# Patient Record
Sex: Male | Born: 1975 | Hispanic: No | Marital: Married | Smoking: Current every day smoker
Health system: Southern US, Community
[De-identification: ages and names within clinical notes are randomized; demographics above are authoritative.]

---

## 2015-09-11 ENCOUNTER — Emergency Department (HOSPITAL_COMMUNITY)
Admission: EM | Admit: 2015-09-11 | Discharge: 2015-09-11 | Disposition: A | Discharge status: Home / Self Care | Payer: No Typology Code available for payment source | Attending: Emergency Medicine | Admitting: Emergency Medicine

## 2015-09-11 ENCOUNTER — Emergency Department (HOSPITAL_COMMUNITY): Payer: No Typology Code available for payment source

## 2015-09-11 ENCOUNTER — Encounter (HOSPITAL_COMMUNITY): Payer: Self-pay | Admitting: *Deleted

## 2015-09-11 DIAGNOSIS — Y998 Other external cause status: Secondary | ICD-10-CM | POA: Diagnosis not present

## 2015-09-11 DIAGNOSIS — S134XXA Sprain of ligaments of cervical spine, initial encounter: Secondary | ICD-10-CM | POA: Insufficient documentation

## 2015-09-11 DIAGNOSIS — Y92410 Unspecified street and highway as the place of occurrence of the external cause: Secondary | ICD-10-CM | POA: Insufficient documentation

## 2015-09-11 DIAGNOSIS — S8391XA Sprain of unspecified site of right knee, initial encounter: Secondary | ICD-10-CM | POA: Diagnosis not present

## 2015-09-11 DIAGNOSIS — S86911A Strain of unspecified muscle(s) and tendon(s) at lower leg level, right leg, initial encounter: Secondary | ICD-10-CM | POA: Diagnosis not present

## 2015-09-11 DIAGNOSIS — S139XXA Sprain of joints and ligaments of unspecified parts of neck, initial encounter: Secondary | ICD-10-CM

## 2015-09-11 DIAGNOSIS — Y9389 Activity, other specified: Secondary | ICD-10-CM | POA: Insufficient documentation

## 2015-09-11 DIAGNOSIS — S199XXA Unspecified injury of neck, initial encounter: Secondary | ICD-10-CM | POA: Diagnosis present

## 2015-09-11 MED ORDER — NAPROXEN 500 MG PO TABS: 500.0000 mg | ORAL_TABLET | Freq: Two times a day (BID) | ORAL | Status: DC

## 2015-09-11 MED ORDER — METHOCARBAMOL 500 MG PO TABS: 500.0000 mg | ORAL_TABLET | Freq: Three times a day (TID) | ORAL | Status: DC | PRN

## 2015-09-11 NOTE — ED Notes (Signed)
Pt is in stable condition upon d/c and ambulates from ED. 
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
## 2015-09-11 NOTE — ED Notes (Signed)
Patient transported to CT 
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
## 2015-09-18 ENCOUNTER — Emergency Department (HOSPITAL_COMMUNITY)
Admission: EM | Admit: 2015-09-18 | Discharge: 2015-09-18 | Disposition: A | Discharge status: Home / Self Care | Payer: No Typology Code available for payment source | Attending: Emergency Medicine | Admitting: Emergency Medicine

## 2015-09-18 ENCOUNTER — Emergency Department (HOSPITAL_COMMUNITY): Payer: No Typology Code available for payment source

## 2015-09-18 ENCOUNTER — Encounter (HOSPITAL_COMMUNITY): Payer: Self-pay | Admitting: *Deleted

## 2015-09-18 DIAGNOSIS — Z791 Long term (current) use of non-steroidal anti-inflammatories (NSAID): Secondary | ICD-10-CM | POA: Insufficient documentation

## 2015-09-18 DIAGNOSIS — Y998 Other external cause status: Secondary | ICD-10-CM | POA: Insufficient documentation

## 2015-09-18 DIAGNOSIS — Y92410 Unspecified street and highway as the place of occurrence of the external cause: Secondary | ICD-10-CM | POA: Insufficient documentation

## 2015-09-18 DIAGNOSIS — R51 Headache: Secondary | ICD-10-CM

## 2015-09-18 DIAGNOSIS — S060X0A Concussion without loss of consciousness, initial encounter: Secondary | ICD-10-CM

## 2015-09-18 DIAGNOSIS — Y9389 Activity, other specified: Secondary | ICD-10-CM | POA: Insufficient documentation

## 2015-09-18 DIAGNOSIS — R519 Headache, unspecified: Secondary | ICD-10-CM

## 2015-09-18 MED ORDER — HYDROCODONE-ACETAMINOPHEN 5-325 MG PO TABS: 1.0000 | ORAL_TABLET | Freq: Four times a day (QID) | ORAL | Status: DC | PRN
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
## 2015-09-18 NOTE — Discharge Instructions (Signed)
Concussion, Adult A concussion, or closed-head injury, is a brain injury caused by a direct blow to the head or by a quick and sudden movement (jolt) of the head or neck. Concussions are usually not life-threatening. Even so, the effects of a concussion can be serious. If you have had a concussion before, you are more likely to experience concussion-like symptoms after a direct blow to the head.  CAUSES  Direct blow to the head, such as from running into another player during a soccer game, being hit in a fight, or hitting your head on a hard surface.  A jolt of the head or neck that causes the brain to move back and forth inside the skull, such as in a car crash. SIGNS AND SYMPTOMS The signs of a concussion can be hard to notice. Early on, they may be missed by you, family members, and health care providers. You may look fine but act or feel differently. Symptoms are usually temporary, but they may last for days, weeks, or even longer. Some symptoms may appear right away while others may not show up for hours or days. Every head injury is different. Symptoms include:  Mild to moderate headaches that will not go away.  A feeling of pressure inside your head.  Having more trouble than usual:  Learning or remembering things you have heard.  Answering questions.  Paying attention or concentrating.  Organizing daily tasks.  Making decisions and solving problems.  Slowness in thinking, acting or reacting, speaking, or reading.  Getting lost or being easily confused.  Feeling tired all the time or lacking energy (fatigued).  Feeling drowsy.  Sleep disturbances.  Sleeping more than usual.  Sleeping less than usual.  Trouble falling asleep.  Trouble sleeping (insomnia).  Loss of balance or feeling lightheaded or dizzy.  Nausea or vomiting.  Numbness or tingling.  Increased sensitivity to:  Sounds.  Lights.  Distractions.  Vision problems or eyes that tire  easily.  Diminished sense of taste or smell.  Ringing in the ears.  Mood changes such as feeling sad or anxious.  Becoming easily irritated or angry for little or no reason.  Lack of motivation.  Seeing or hearing things other people do not see or hear (hallucinations). DIAGNOSIS Your health care provider can usually diagnose a concussion based on a description of your injury and symptoms. He or she will ask whether you passed out (lost consciousness) and whether you are having trouble remembering events that happened right before and during your injury. Your evaluation might include:  A brain scan to look for signs of injury to the brain. Even if the test shows no injury, you may still have a concussion.  Blood tests to be sure other problems are not present. TREATMENT  Concussions are usually treated in an emergency department, in urgent care, or at a clinic. You may need to stay in the hospital overnight for further treatment.  Tell your health care provider if you are taking any medicines, including prescription medicines, over-the-counter medicines, and natural remedies. Some medicines, such as blood thinners (anticoagulants) and aspirin, may increase the chance of complications. Also tell your health care provider whether you have had alcohol or are taking illegal drugs. This information may affect treatment.  Your health care provider will send you home with important instructions to follow.  How fast you will recover from a concussion depends on many factors. These factors include how severe your concussion is, what part of your brain was injured,   your age, and how healthy you were before the concussion.  Most people with mild injuries recover fully. Recovery can take time. In general, recovery is slower in older persons. Also, persons who have had a concussion in the past or have other medical problems may find that it takes longer to recover from their current injury. HOME  CARE INSTRUCTIONS General Instructions  Carefully follow the directions your health care provider gave you.  Only take over-the-counter or prescription medicines for pain, discomfort, or fever as directed by your health care provider.  Take only those medicines that your health care provider has approved.  Do not drink alcohol until your health care provider says you are well enough to do so. Alcohol and certain other drugs may slow your recovery and can put you at risk of further injury.  If it is harder than usual to remember things, write them down.  If you are easily distracted, try to do one thing at a time. For example, do not try to watch TV while fixing dinner.  Talk with family members or close friends when making important decisions.  Keep all follow-up appointments. Repeated evaluation of your symptoms is recommended for your recovery.  Watch your symptoms and tell others to do the same. Complications sometimes occur after a concussion. Older adults with a brain injury may have a higher risk of serious complications, such as a blood clot on the brain.  Tell your teachers, school nurse, school counselor, coach, athletic trainer, or work manager about your injury, symptoms, and restrictions. Tell them about what you can or cannot do. They should watch for:  Increased problems with attention or concentration.  Increased difficulty remembering or learning new information.  Increased time needed to complete tasks or assignments.  Increased irritability or decreased ability to cope with stress.  Increased symptoms.  Rest. Rest helps the brain to heal. Make sure you:  Get plenty of sleep at night. Avoid staying up late at night.  Keep the same bedtime hours on weekends and weekdays.  Rest during the day. Take daytime naps or rest breaks when you feel tired.  Limit activities that require a lot of thought or concentration. These include:  Doing homework or job-related  work.  Watching TV.  Working on the computer.  Avoid any situation where there is potential for another head injury (football, hockey, soccer, basketball, martial arts, downhill snow sports and horseback riding). Your condition will get worse every time you experience a concussion. You should avoid these activities until you are evaluated by the appropriate follow-up health care providers. Returning To Your Regular Activities You will need to return to your normal activities slowly, not all at once. You must give your body and brain enough time for recovery.  Do not return to sports or other athletic activities until your health care provider tells you it is safe to do so.  Ask your health care provider when you can drive, ride a bicycle, or operate heavy machinery. Your ability to react may be slower after a brain injury. Never do these activities if you are dizzy.  Ask your health care provider about when you can return to work or school. Preventing Another Concussion It is very important to avoid another brain injury, especially before you have recovered. In rare cases, another injury can lead to permanent brain damage, brain swelling, or death. The risk of this is greatest during the first 7-10 days after a head injury. Avoid injuries by:  Wearing a   seat belt when riding in a car.  Drinking alcohol only in moderation.  Wearing a helmet when biking, skiing, skateboarding, skating, or doing similar activities.  Avoiding activities that could lead to a second concussion, such as contact or recreational sports, until your health care provider says it is okay.  Taking safety measures in your home.  Remove clutter and tripping hazards from floors and stairways.  Use grab bars in bathrooms and handrails by stairs.  Place non-slip mats on floors and in bathtubs.  Improve lighting in dim areas. SEEK MEDICAL CARE IF:  You have increased problems paying attention or  concentrating.  You have increased difficulty remembering or learning new information.  You need more time to complete tasks or assignments than before.  You have increased irritability or decreased ability to cope with stress.  You have more symptoms than before. Seek medical care if you have any of the following symptoms for more than 2 weeks after your injury:  Lasting (chronic) headaches.  Dizziness or balance problems.  Nausea.  Vision problems.  Increased sensitivity to noise or light.  Depression or mood swings.  Anxiety or irritability.  Memory problems.  Difficulty concentrating or paying attention.  Sleep problems.  Feeling tired all the time. SEEK IMMEDIATE MEDICAL CARE IF:  You have severe or worsening headaches. These may be a sign of a blood clot in the brain.  You have weakness (even if only in one hand, leg, or part of the face).  You have numbness.  You have decreased coordination.  You vomit repeatedly.  You have increased sleepiness.  One pupil is larger than the other.  You have convulsions.  You have slurred speech.  You have increased confusion. This may be a sign of a blood clot in the brain.  You have increased restlessness, agitation, or irritability.  You are unable to recognize people or places.  You have neck pain.  It is difficult to wake you up.  You have unusual behavior changes.  You lose consciousness. MAKE SURE YOU:  Understand these instructions.  Will watch your condition.  Will get help right away if you are not doing well or get worse.   This information is not intended to replace advice given to you by your health care provider. Make sure you discuss any questions you have with your health care provider.   Document Released: 07/31/2003 Document Revised: 05/31/2014 Document Reviewed: 11/30/2012 Elsevier Interactive Patient Education 2016 [employer]   Post-Concussion Syndrome Post-concussion  syndrome describes the symptoms that can occur after a head injury. These symptoms can last from weeks to months. CAUSES  It is not clear why some head injuries cause post-concussion syndrome. It can occur whether your head injury was mild or severe and whether you were wearing head protection or not.  SIGNS AND SYMPTOMS  Memory difficulties.  Dizziness.  Headaches.  Double vision or blurry vision.  Sensitivity to light.  Hearing difficulties.  Depression.  Tiredness.  Weakness.  Difficulty with concentration.  Difficulty sleeping or staying asleep.  Vomiting.  Poor balance or instability on your feet.  Slow reaction time.  Difficulty learning and remembering things you have heard. DIAGNOSIS  There is no test to determine whether you have post-concussion syndrome. Your health care provider may order an imaging scan of your brain, such as a CT scan, to check for other problems that may be causing your symptoms (such as a severe injury inside your skull). TREATMENT  Usually, these problems disappear over   time without medical care. Your health care provider may prescribe medicine to help ease your symptoms. It is important to follow up with a neurologist to evaluate your recovery and address any lingering symptoms or issues. HOME CARE INSTRUCTIONS   Take medicines only as directed by your health care provider. Do not take aspirin. Aspirin can slow blood clotting.  Sleep with your head slightly elevated to help with headaches.  Avoid any situation where there is potential for another head injury. This includes football, hockey, soccer, basketball, martial arts, downhill snow sports, and horseback riding. Your condition will get worse every time you experience a concussion. You should avoid these activities until you are evaluated by the appropriate follow-up health care providers.  Keep all follow-up visits as directed by your health care provider. This is important. SEEK  MEDICAL CARE IF:  You have increased problems paying attention or concentrating.  You have increased difficulty remembering or learning new information.  You need more time to complete tasks or assignments than before.  You have increased irritability or decreased ability to cope with stress.  You have more symptoms than before. Seek medical care if you have any of the following symptoms for more than two weeks after your injury:  Lasting (chronic) headaches.  Dizziness or balance problems.  Nausea.  Vision problems.  Increased sensitivity to noise or light.  Depression or mood swings.  Anxiety or irritability.  Memory problems.  Difficulty concentrating or paying attention.  Sleep problems.  Feeling tired all the time. SEEK IMMEDIATE MEDICAL CARE IF:  You have confusion or unusual drowsiness.  Others find it difficult to wake you up.  You have nausea or persistent, forceful vomiting.  You feel like you are moving when you are not (vertigo). Your eyes may move rapidly back and forth.  You have convulsions or faint.  You have severe, persistent headaches that are not relieved by medicine.  You cannot use your arms or legs normally.  One of your pupils is larger than the other.  You have clear or bloody discharge from your nose or ears.  Your problems are getting worse, not better. MAKE SURE YOU:  Understand these instructions.  Will watch your condition.  Will get help right away if you are not doing well or get worse.   This information is not intended to replace advice given to you by your health care provider. Make sure you discuss any questions you have with your health care provider.   Document Released: 10/30/2001 Document Revised: 05/31/2014 Document Reviewed: 08/15/2013 Elsevier Interactive Patient Education 2016 [employer]  

## 2015-09-18 NOTE — ED Notes (Signed)
Declined W/C at D/C and was escorted to lobby by RN. 

## 2016-05-20 ENCOUNTER — Ambulatory Visit (INDEPENDENT_AMBULATORY_CARE_PROVIDER_SITE_OTHER): Payer: BLUE CROSS/BLUE SHIELD | Admitting: Urgent Care

## 2016-05-20 VITALS — BP 158/80 | HR 111 | Temp 98.1°F | Resp 16 | Wt 231.0 lb

## 2016-05-20 DIAGNOSIS — F17200 Nicotine dependence, unspecified, uncomplicated: Secondary | ICD-10-CM | POA: Diagnosis not present

## 2016-05-20 DIAGNOSIS — Z0000 Encounter for general adult medical examination without abnormal findings: Secondary | ICD-10-CM | POA: Diagnosis not present

## 2016-05-20 DIAGNOSIS — R030 Elevated blood-pressure reading, without diagnosis of hypertension: Secondary | ICD-10-CM

## 2016-05-20 NOTE — Progress Notes (Signed)
MRN: [id number]  Subjective:   [name] is a 40 y.o. male presenting for annual physical exam and wants a work form completed.  Patient is married, does not have children. 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 Has good relationships at home, has a good support network. Smokes 1/4 ppd, plans on quitting.   Medical care team includes: PCP: No PCP Per Patient Vision: Last eye exam was ~1 year ago. He is supposed to wear contacts.  Dental: Gets cleanings inconsistently.  Specialists: None.  Isaiah Pierce is not currently taking any medications. He has No Known Allergies.  Isaiah Pierce Denies past medical history. Denies past surgical history.   Denies family history of cancer, diabetes, HTN, HL, heart disease, stroke, mental illness.   Immunizations: Refused influenza vaccine.  Review of Systems  Constitutional: Negative for chills, diaphoresis, fever, malaise/fatigue and weight loss.  HENT: Negative for congestion, ear discharge, ear pain, hearing loss, nosebleeds, sore throat and tinnitus.   Eyes: Negative for blurred vision, double vision, photophobia, pain, discharge and redness.  Respiratory: Negative for cough, shortness of breath and wheezing.   Cardiovascular: Negative for chest pain, palpitations and leg swelling.  Gastrointestinal: Negative for abdominal pain, blood in stool, constipation, diarrhea, nausea and vomiting.  Genitourinary: Negative for dysuria, flank pain, frequency, hematuria and urgency.  Musculoskeletal: Negative for back pain, joint pain and myalgias.  Skin: Negative for itching and rash.  Neurological: Negative for dizziness, tingling, seizures, loss of consciousness, weakness and headaches.  Endo/Heme/Allergies: Negative for polydipsia.  Psychiatric/Behavioral: Negative for depression, hallucinations, memory loss, substance abuse and suicidal ideas. The patient is not nervous/anxious and does not have insomnia.    Objective:   Vitals: BP (!) 158/80 (BP  Location: Right Arm, Patient Position: Sitting, Cuff Size: Small)   Pulse (!) 111   Temp 98.1 F (36.7 C) (Oral)   Resp 16   Wt 231 lb (104.8 kg)   SpO2 98%    Visual Acuity Screening   Right eye Left eye Both eyes  Without correction: 20/70 20/70 20/50   With correction:       Physical Exam  Constitutional: He is oriented to person, place, and time. He appears well-developed and well-nourished.  HENT:  TM's intact bilaterally, no effusions or erythema. Nasal turbinates pink and moist, nasal passages patent. No sinus tenderness. Oropharynx clear, mucous membranes moist, dentition in good repair.  Eyes: Conjunctivae and EOM are normal. Pupils are equal, round, and reactive to light. Right eye exhibits no discharge. Left eye exhibits no discharge. No scleral icterus.  Neck: Normal range of motion. Neck supple. No thyromegaly present.  Cardiovascular: Normal rate, regular rhythm and intact distal pulses.  Exam reveals no gallop and no friction rub.   No murmur heard. Pulmonary/Chest: No stridor. No respiratory distress. He has no wheezes. He has no rales.  Abdominal: Soft. Bowel sounds are normal. He exhibits no distension and no mass. There is no tenderness.  Musculoskeletal: Normal range of motion. He exhibits no edema or tenderness.  Lymphadenopathy:    He has no cervical adenopathy.  Neurological: He is alert and oriented to person, place, and time. He has normal reflexes.  Skin: Skin is warm and dry. No rash noted. No erythema. No pallor.  Psychiatric: He has a normal mood and affect.   Assessment and Plan :   1. Annual physical exam - Labs pending. Discussed healthy lifestyle, diet, exercise, preventative care, vaccinations, and addressed patient's concerns.   2. Elevated blood pressure reading without diagnosis of  hypertension - Patient states that he drank a lot of coffee and soda today. He also feels stressed that this is his last day to get his form for an annual physical  exam completed. He agreed to check his BP and rtc in 4 weeks.  3. Tobacco use disorder - Encourage smoking cessation. He plans on completing a program through his employer.   Wallis BambergMario Cristen Bredeson, PA-C [facility] [phone] 05/20/2016  5:04 PM

## 2016-05-20 NOTE — Patient Instructions (Addendum)

## 2016-05-21 LAB — CBC
HEMATOCRIT: 45.4 % (ref 37.5–51.0)
Hemoglobin: 15.6 g/dL (ref 13.0–17.7)
MCH: 30.1 pg (ref 26.6–33.0)
MCHC: 34.4 g/dL (ref 31.5–35.7)
MCV: 88 fL (ref 79–97)
Platelets: 194 10*3/uL (ref 150–379)
RBC: 5.19 x10E6/uL (ref 4.14–5.80)
RDW: 13.7 % (ref 12.3–15.4)
WBC: 7.6 10*3/uL (ref 3.4–10.8)

## 2016-05-21 LAB — TSH: TSH: 4.25 u[IU]/mL (ref 0.450–4.500)

## 2016-05-22 LAB — BASIC METABOLIC PANEL
BUN / CREAT RATIO: 17 (ref 9–20)
BUN: 16 mg/dL (ref 6–20)
CO2: 21 mmol/L (ref 18–29)
Calcium: 9.8 mg/dL (ref 8.7–10.2)
Chloride: 98 mmol/L (ref 96–106)
Creatinine, Ser: 0.94 mg/dL (ref 0.76–1.27)
GFR calc non Af Amer: 102 mL/min/{1.73_m2} (ref >59)
GFR, EST AFRICAN AMERICAN: 118 mL/min/{1.73_m2} (ref >59)
GLUCOSE: 85 mg/dL (ref 65–99)
Potassium: 4.3 mmol/L (ref 3.5–5.2)
SODIUM: 140 mmol/L (ref 134–144)

## 2016-05-22 LAB — SPECIMEN STATUS REPORT

## 2016-05-22 LAB — LIPID PANEL
CHOL/HDL RATIO: 8.7 ratio — AB (ref 0.0–5.0)
CHOLESTEROL TOTAL: 305 mg/dL — AB (ref 100–199)
HDL: 35 mg/dL — ABNORMAL LOW (ref >39)
LDL Calculated: 194 mg/dL — ABNORMAL HIGH (ref 0–99)
Triglycerides: 380 mg/dL — ABNORMAL HIGH (ref 0–149)
VLDL CHOLESTEROL CAL: 76 mg/dL — AB (ref 5–40)

## 2016-05-25 ENCOUNTER — Encounter: Payer: Self-pay | Admitting: Urgent Care
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
